# Patient Record
Sex: Male | Born: 2000 | Race: White | Hispanic: No | Marital: Single | Smoking: Never smoker
Health system: Southern US, Community
[De-identification: ages and names within clinical notes are randomized; demographics above are authoritative.]

---
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
## 2018-01-04 ENCOUNTER — Ambulatory Visit (INDEPENDENT_AMBULATORY_CARE_PROVIDER_SITE_OTHER): Payer: 59 | Admitting: Sports Medicine

## 2018-01-04 ENCOUNTER — Encounter: Payer: Self-pay | Admitting: Sports Medicine

## 2018-01-04 VITALS — BP 120/78 | HR 78 | Ht 72.02 in | Wt 193.0 lb

## 2018-01-04 DIAGNOSIS — M67432 Ganglion, left wrist: Secondary | ICD-10-CM | POA: Diagnosis not present

## 2018-01-04 DIAGNOSIS — M25532 Pain in left wrist: Secondary | ICD-10-CM | POA: Diagnosis not present

## 2018-01-04 DIAGNOSIS — M25332 Other instability, left wrist: Secondary | ICD-10-CM

## 2018-01-04 MED ORDER — IBUPROFEN-FAMOTIDINE 800-26.6 MG PO TABS: 1.0000 | ORAL_TABLET | Freq: Three times a day (TID) | ORAL | 2 refills | Status: AC | PRN

## 2018-01-04 NOTE — Progress Notes (Signed)
Adam Guerrero. [name], [place] at [place] - 17 y.o. male MRN [id number]  Date of birth: 16-Oct-2000  Visit Date: 01/04/2018   PCP: Patient, No Pcp Per   Referred by: No ref. provider found   SUBJECTIVE:  Chief Complaint  Patient presents with  . f/u L wrist    Steroid inj 10/30/17, reports good improvement. He is able to bend the wrist with minimal pain.     HPI: Patient is here for follow-up of left wrist pain.  Reports overall good improvement.  Is still continuing to give him some issues with typing and weightlifting.  He has run out of the Duexis prescription but has only been needing to take over-the-counter ibuprofen intermittently.  He underwent a injection as above.  REVIEW OF SYSTEMS: He denies any nighttime disturbances.  No clicking or significant popping.   HISTORY:  Prior history reviewed and updated per electronic medical record.  Social History   Occupational History  . Not on file  Tobacco Use  . Smoking status: Never Smoker  . Smokeless tobacco: Never Used  Substance and Sexual Activity  . Alcohol use: Not on file  . Drug use: Not on file  . Sexual activity: Not on file   Social History   Social History Narrative  . Not on file    OBJECTIVE:  VS:  HT:6' 0.02" (182.9 cm)   WT:193 lb (87.5 kg)  BMI:26.17    BP:120/78  HR:78bpm  TEMP: ( )  RESP:98 %   PHYSICAL EXAM: Pleasant young adult male.  No acute distress.  Alert and appropriate.  Left wrist is overall well aligned.  He is a small amount of pain over the scapholunate interval but this is minimal.  He has small amount of pain with ulnar deviation without clicking or catching.  He has no pain with ulnar deviation with axial load.  Grip strength is intact.  Normal elbow range of motion.  There is a small cystic mass across the dorsum of the wrist but this is minimal.   Prior arthrogram reassuring for no significant soft  tissue injury.  There is a well-defined 5 x 14 x 8 mm ganglion cyst from the midcarpal joint.  ASSESSMENT   1. Left wrist pain   2. Ganglion cyst of wrist, left   3. Carpal instability of left wrist     PLAN:  Pertinent additional documentation may be included in corresponding procedure notes, imaging studies, problem based documentation and patient instructions.  Procedures:  None  Medications:  Meds ordered this encounter  Medications  . Ibuprofen-Famotidine (DUEXIS) 800-26.6 MG TABS    Sig: Take 1 tablet by mouth 3 (three) times daily as needed. 1 tab po tid X 14 days then 1 tab po tid as needed    Dispense:  90 tablet    Refill:  2    Home Phone      [phone] Mobile          [phone]     Discussion/Instructions: No problem-specific Assessment & Plan notes found for this encounter.  We discussed multiple options with him today.  He does have some slight instability but no significant widening of the scapholunate interval.  At this point avoidance of terminal wrist extension is recommended.  Intermittent use of anti-inflammatories as needed.  Prescription provided today.    If any lack of improvement: Consider referral to Ortho hand for arthroscopic debridement and cyst  excision.   Return if symptoms worsen or fail to improve.          Gerda Diss, [facility] Physician

## 2018-01-22 DIAGNOSIS — M9902 Segmental and somatic dysfunction of thoracic region: Secondary | ICD-10-CM | POA: Diagnosis not present

## 2018-01-22 DIAGNOSIS — M542 Cervicalgia: Secondary | ICD-10-CM | POA: Diagnosis not present

## 2018-01-22 DIAGNOSIS — M9901 Segmental and somatic dysfunction of cervical region: Secondary | ICD-10-CM | POA: Diagnosis not present

## 2018-01-25 DIAGNOSIS — M9901 Segmental and somatic dysfunction of cervical region: Secondary | ICD-10-CM | POA: Diagnosis not present

## 2018-01-25 DIAGNOSIS — M542 Cervicalgia: Secondary | ICD-10-CM | POA: Diagnosis not present

## 2018-01-25 DIAGNOSIS — M9902 Segmental and somatic dysfunction of thoracic region: Secondary | ICD-10-CM | POA: Diagnosis not present

## 2018-02-01 DIAGNOSIS — M542 Cervicalgia: Secondary | ICD-10-CM | POA: Diagnosis not present

## 2018-02-01 DIAGNOSIS — M9901 Segmental and somatic dysfunction of cervical region: Secondary | ICD-10-CM | POA: Diagnosis not present

## 2018-02-01 DIAGNOSIS — M9902 Segmental and somatic dysfunction of thoracic region: Secondary | ICD-10-CM | POA: Diagnosis not present

## 2018-02-15 DIAGNOSIS — M9902 Segmental and somatic dysfunction of thoracic region: Secondary | ICD-10-CM | POA: Diagnosis not present

## 2018-02-15 DIAGNOSIS — M9901 Segmental and somatic dysfunction of cervical region: Secondary | ICD-10-CM | POA: Diagnosis not present

## 2018-02-15 DIAGNOSIS — M542 Cervicalgia: Secondary | ICD-10-CM | POA: Diagnosis not present

## 2018-02-18 DIAGNOSIS — M24812 Other specific joint derangements of left shoulder, not elsewhere classified: Secondary | ICD-10-CM | POA: Diagnosis not present

## 2018-02-18 DIAGNOSIS — S4992XA Unspecified injury of left shoulder and upper arm, initial encounter: Secondary | ICD-10-CM | POA: Diagnosis not present

## 2018-02-20 ENCOUNTER — Telehealth: Payer: Self-pay | Admitting: Sports Medicine

## 2018-02-20 NOTE — Telephone Encounter (Signed)
Scheduled pt for 02/21/18 at 12:40. Dr. Paulla Fore aware.

## 2018-02-20 NOTE — Telephone Encounter (Signed)
Caller states that has had a torn muscle and had injections and movement therapy and PT. pain for past couple months has been non existent, when woke up this morning unable to lift arm higher than chest level and cannot rotate arm (having pain 4 without movement, when moves 10) also didn't have surgery on it. Per [facility]

## 2018-02-21 ENCOUNTER — Ambulatory Visit: Payer: 59 | Admitting: Sports Medicine

## 2018-02-21 ENCOUNTER — Ambulatory Visit (INDEPENDENT_AMBULATORY_CARE_PROVIDER_SITE_OTHER): Payer: 59

## 2018-02-21 ENCOUNTER — Ambulatory Visit: Payer: Self-pay

## 2018-02-21 ENCOUNTER — Encounter: Payer: Self-pay | Admitting: Sports Medicine

## 2018-02-21 VITALS — BP 122/74 | HR 74 | Ht 72.04 in | Wt 192.2 lb

## 2018-02-21 DIAGNOSIS — M25312 Other instability, left shoulder: Secondary | ICD-10-CM

## 2018-02-21 DIAGNOSIS — M25512 Pain in left shoulder: Secondary | ICD-10-CM | POA: Diagnosis not present

## 2018-02-21 MED ORDER — IBUPROFEN-FAMOTIDINE 800-26.6 MG PO TABS: 1.0000 | ORAL_TABLET | Freq: Three times a day (TID) | ORAL | 2 refills | Status: AC | PRN

## 2018-02-21 MED ORDER — IBUPROFEN-FAMOTIDINE 800-26.6 MG PO TABS: 1.0000 | ORAL_TABLET | Freq: Three times a day (TID) | ORAL | 0 refills | Status: AC | PRN

## 2018-02-21 NOTE — Progress Notes (Signed)
Adam Guerrero. [name], [place] [facility] [place] - 18 y.o. male MRN [id number]  Date of birth: 04-10-2000  Visit Date: February 24, 2018  PCP: Patient, No Pcp Per   Referred by: No ref. provider found  SUBJECTIVE:  Chief Complaint  Patient presents with  . Left Shoulder - Follow-up  . Initial Assessment    L shoulder pain secondary to dislocation    HPI: Patient is here for an acute work in visit after waking up first thing in the morning 3 days ago noticing that his shoulder was swollen and markedly painful with any type of abduction, forward flexion or internal rotation activities.  He had acute onset of pain at home and struck his knees while gently reaching overhead.  He denies knowing any eliciting injury and was seen by the [facility] off campus Dr. who was suspicious for a nocturnal dislocation.  The placed in a shoulder immobilizer and gave him anti-inflammatories but he has not been taking these he is only been using the shoulder immobilizer.  He has been seen by Dr. 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  REVIEW OF SYSTEMS: Denies fevers, chills, recent weight gain or weight loss.  No night sweats. No significant nighttime awakenings due to this issue.  HISTORY:  Prior history reviewed and updated per electronic medical record.  Patient Active Problem List   Diagnosis Date Noted  . Left wrist pain 09/30/2017    10/27/2017 MR arthrogram IMPRESSION: 1. 14 mm ganglion cyst dorsal to the proximal capitate. 2. No ligamentous injury.  Intact TFCC.   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  There is a questionable area on sagittal views of the inferior glenoid that raises my  suspicion for labral injury    Social History   Occupational History  . Not on file  Tobacco Use  . Smoking status: Never Smoker  . Smokeless tobacco: Never Used  Substance and Sexual Activity  . Alcohol use: Not on file  . Drug use: Not on file  . Sexual activity: Not on file   Social History   Social History Narrative  . Not on file     OBJECTIVE:  VS:  HT:6' 0.04" (183 cm)   WT:192 lb 3.2 oz (87.2 kg)  BMI:26.03    BP:122/74  HR:74bpm  TEMP: ( )  RESP:96 %   PHYSICAL EXAM: His left shoulder is slightly more swollen than the right side.  There is no significant bruising or ecchymosis.  He has pain with any type of forward flexion or abduction past 60 degrees.  His internal rotation strength is intact however external rotation strength is diminished.  He has a markedly positive sag sign with positive clunk test.  Three-view x-rays of the shoulder obtained today that are negative for acute fracture.   ASSESSMENT:  1. Shoulder instability, left     PROCEDURES:    none  PLAN:  Pertinent additional documentation may be included in corresponding procedure notes, imaging studies, problem based documentation and patient instructions.  No problem-specific Assessment & Plan notes found for this encounter.   This is concerning that he has had a nontraumatic shoulder dislocation  Patient needs to have this surgically stabilized and he  will be following up with Dr. Tamera Punt for this.  Shoulder immobilizer provided today and refill on [facility] provided.  X-rays reassuring for no acute bony involvement.   Meds ordered this encounter  Medications  . Ibuprofen-Famotidine (DUEXIS) 800-26.6 MG TABS    Sig: Take 1 tablet by mouth 3 (three) times daily as needed. 1 tab po tid X 14 days then 1 tab po tid as needed    Dispense:  90 tablet    Refill:  2    Home Phone      [phone] Mobile          [phone]   . Ibuprofen-Famotidine (DUEXIS) 800-26.6 MG TABS    Sig: Take  1 tablet by mouth 3 (three) times daily as needed for up to 1 day.    Dispense:  9 tablet    Refill:  0   Lab Orders  No laboratory test(s) ordered today    Imaging Orders     DG Shoulder Left  Referral Orders     Ambulatory referral to Orthopedics   No follow-ups on file.          Gerda Diss, [facility] Physician

## 2018-02-21 NOTE — Patient Instructions (Addendum)
Instructions for Duexis, [place] and [facility]:  Your prescription will be filled through a participating [facility] mail order pharmacy.  You will receive a phone call or text from one of the participating pharmacies which can be located in any state in the [place].  You must communicate directly with them to have this medication filled.  When the pharmacy contacts you, they will need your mailing address (for shipment of the medication) andy they will need payment information if you have a copay (typically no more than $10). If you have not heard from them 2-3 days after your appointment with Dr. Paulla Fore, contact [facility] directly at [phone].

## 2018-03-02 DIAGNOSIS — S43432D Superior glenoid labrum lesion of left shoulder, subsequent encounter: Secondary | ICD-10-CM | POA: Diagnosis not present

## 2018-03-06 DIAGNOSIS — M25312 Other instability, left shoulder: Secondary | ICD-10-CM | POA: Diagnosis not present

## 2018-03-06 DIAGNOSIS — S43432D Superior glenoid labrum lesion of left shoulder, subsequent encounter: Secondary | ICD-10-CM | POA: Diagnosis not present

## 2018-03-06 DIAGNOSIS — G8918 Other acute postprocedural pain: Secondary | ICD-10-CM | POA: Diagnosis not present

## 2018-03-06 DIAGNOSIS — S43492A Other sprain of left shoulder joint, initial encounter: Secondary | ICD-10-CM | POA: Diagnosis not present

## 2018-04-19 DIAGNOSIS — S43432D Superior glenoid labrum lesion of left shoulder, subsequent encounter: Secondary | ICD-10-CM | POA: Diagnosis not present

## 2018-04-30 DIAGNOSIS — S43432D Superior glenoid labrum lesion of left shoulder, subsequent encounter: Secondary | ICD-10-CM | POA: Diagnosis not present

## 2018-05-02 DIAGNOSIS — S43432D Superior glenoid labrum lesion of left shoulder, subsequent encounter: Secondary | ICD-10-CM | POA: Diagnosis not present

## 2018-05-03 DIAGNOSIS — S43432D Superior glenoid labrum lesion of left shoulder, subsequent encounter: Secondary | ICD-10-CM | POA: Diagnosis not present

## 2018-05-07 DIAGNOSIS — S43432D Superior glenoid labrum lesion of left shoulder, subsequent encounter: Secondary | ICD-10-CM | POA: Diagnosis not present

## 2018-05-09 DIAGNOSIS — S43432D Superior glenoid labrum lesion of left shoulder, subsequent encounter: Secondary | ICD-10-CM | POA: Diagnosis not present

## 2018-05-12 IMAGING — CR DG CHEST 2V
2 series · 2 of 2 positions shown · non-contrast
Comparison: None

CLINICAL DATA: Cough for 1 month, hoarseness, question pneumonia

EXAM:
CHEST  2 VIEW

[w chest pa]
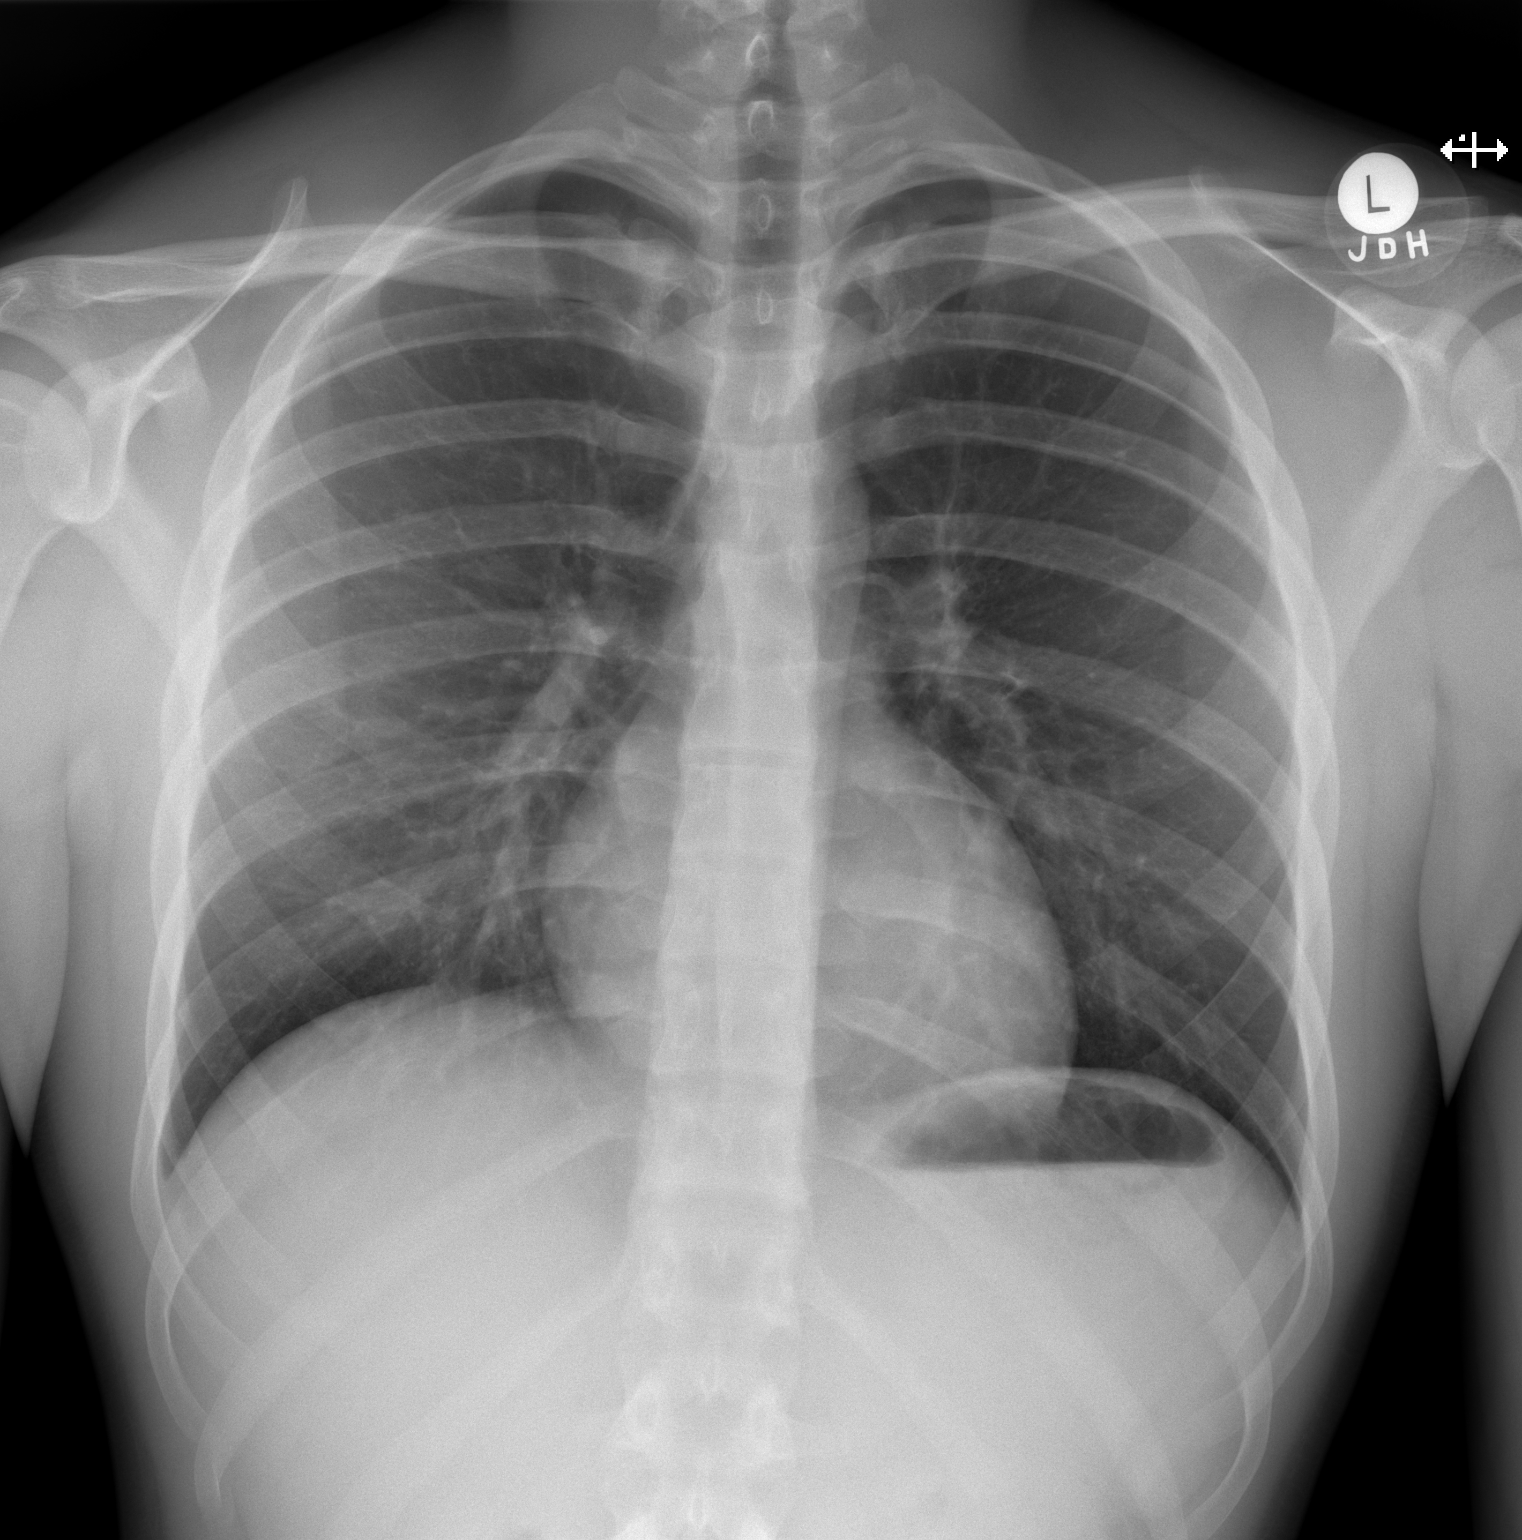

[w chest lat]
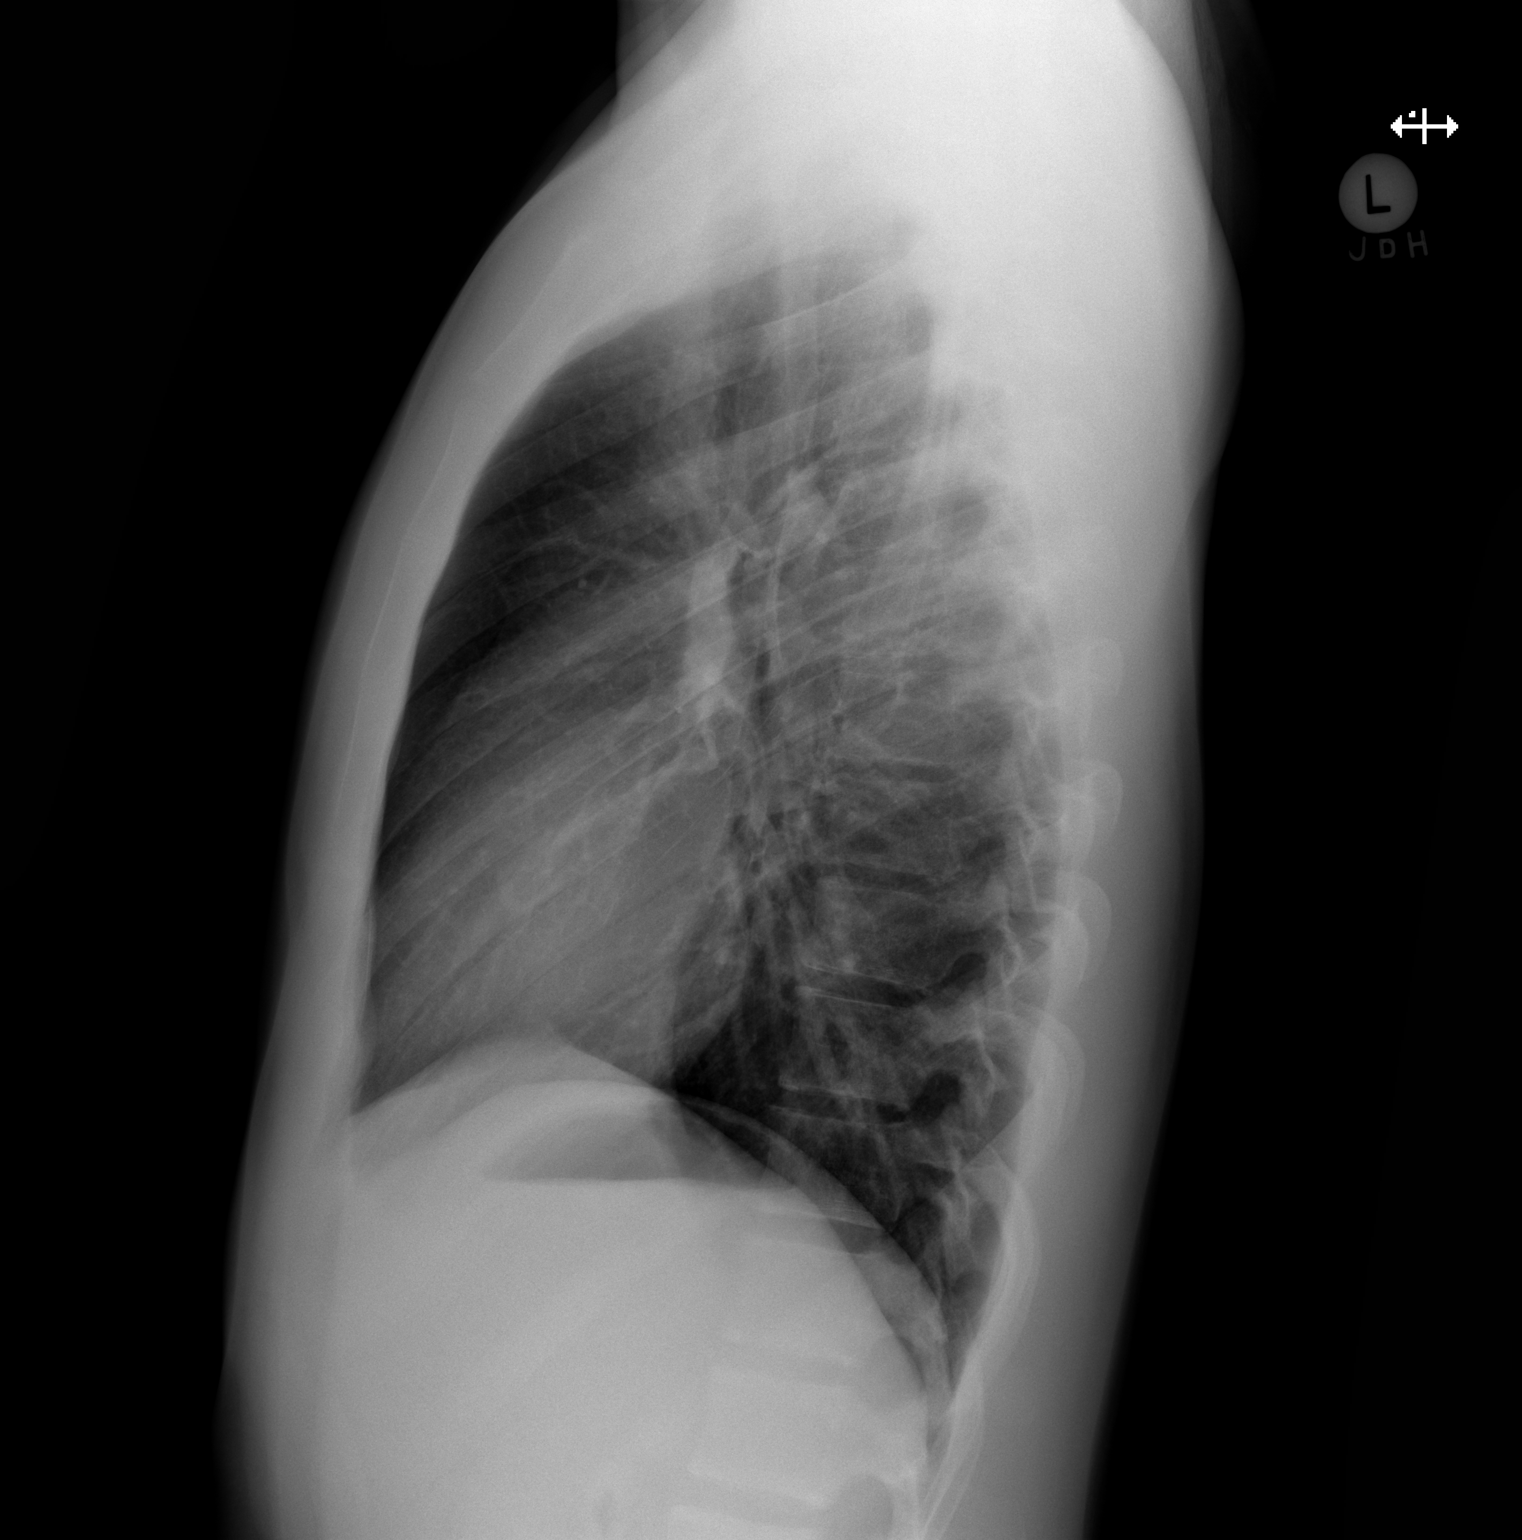

[2 of 2 positions shown; findings below may reference images not displayed]

FINDINGS: Normal heart size, mediastinal contours, and pulmonary vascularity.

Lungs clear.

No pleural effusion or pneumothorax.

Bones unremarkable.
IMPRESSION: Normal exam.

## 2018-05-14 DIAGNOSIS — S43432D Superior glenoid labrum lesion of left shoulder, subsequent encounter: Secondary | ICD-10-CM | POA: Diagnosis not present

## 2018-05-15 DIAGNOSIS — M9901 Segmental and somatic dysfunction of cervical region: Secondary | ICD-10-CM | POA: Diagnosis not present

## 2018-05-15 DIAGNOSIS — M9902 Segmental and somatic dysfunction of thoracic region: Secondary | ICD-10-CM | POA: Diagnosis not present

## 2018-05-15 DIAGNOSIS — M542 Cervicalgia: Secondary | ICD-10-CM | POA: Diagnosis not present

## 2018-05-16 DIAGNOSIS — S43432D Superior glenoid labrum lesion of left shoulder, subsequent encounter: Secondary | ICD-10-CM | POA: Diagnosis not present

## 2018-05-16 DIAGNOSIS — M9901 Segmental and somatic dysfunction of cervical region: Secondary | ICD-10-CM | POA: Diagnosis not present

## 2018-05-16 DIAGNOSIS — M542 Cervicalgia: Secondary | ICD-10-CM | POA: Diagnosis not present

## 2018-05-16 DIAGNOSIS — M9902 Segmental and somatic dysfunction of thoracic region: Secondary | ICD-10-CM | POA: Diagnosis not present

## 2018-05-17 DIAGNOSIS — M542 Cervicalgia: Secondary | ICD-10-CM | POA: Diagnosis not present

## 2018-05-17 DIAGNOSIS — M9901 Segmental and somatic dysfunction of cervical region: Secondary | ICD-10-CM | POA: Diagnosis not present

## 2018-05-17 DIAGNOSIS — M9902 Segmental and somatic dysfunction of thoracic region: Secondary | ICD-10-CM | POA: Diagnosis not present

## 2018-05-21 DIAGNOSIS — S43432D Superior glenoid labrum lesion of left shoulder, subsequent encounter: Secondary | ICD-10-CM | POA: Diagnosis not present

## 2018-05-23 DIAGNOSIS — S43432D Superior glenoid labrum lesion of left shoulder, subsequent encounter: Secondary | ICD-10-CM | POA: Diagnosis not present

## 2018-05-24 DIAGNOSIS — M542 Cervicalgia: Secondary | ICD-10-CM | POA: Diagnosis not present

## 2018-05-24 DIAGNOSIS — M9902 Segmental and somatic dysfunction of thoracic region: Secondary | ICD-10-CM | POA: Diagnosis not present

## 2018-05-24 DIAGNOSIS — M9901 Segmental and somatic dysfunction of cervical region: Secondary | ICD-10-CM | POA: Diagnosis not present

## 2018-06-04 DIAGNOSIS — S43432D Superior glenoid labrum lesion of left shoulder, subsequent encounter: Secondary | ICD-10-CM | POA: Diagnosis not present

## 2019-01-23 IMAGING — MR MR WRIST*L* W/CM
6 series · 40 of 40 positions shown · IV contrast (agent unspecified)
Comparison: Left wrist x-rays dated September 01, 2017.

CONTRAST:  See injection documentation.

CLINICAL DATA: Persistent left wrist pain with numbness and
weakness since fall [REDACTED].

EXAM:
MR OF THE LEFT WRIST WITH CONTRAST (MR ARTHROGRAM)
TECHNIQUE: Multiplanar, multisequence MR imaging of the left wrist was
performed following the administration of intra-articular contrast.

[Series 3: T2 fat-sat · axial · left · 3.0mm · 0.31mm/px · z∈[-60,+16]mm · 8 of 25 slices shown (1 of 2)]
[im 1/25]
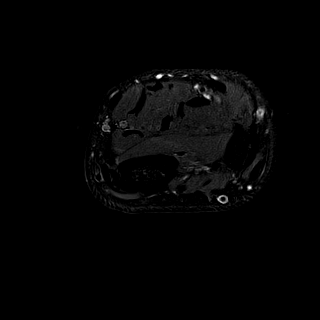
[im 4/25]
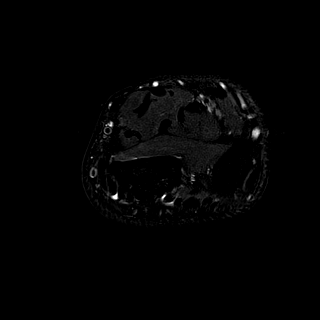
[im 7/25]
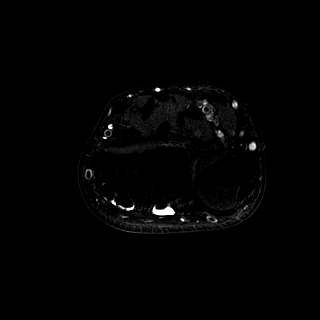
[im 11/25]
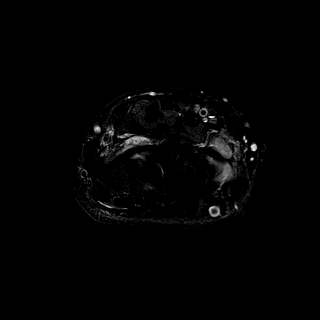
[im 14/25]
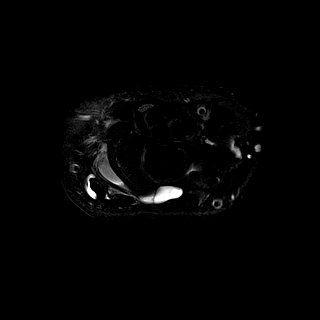
[im 18/25]
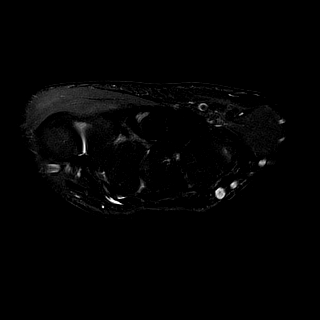
[im 21/25]
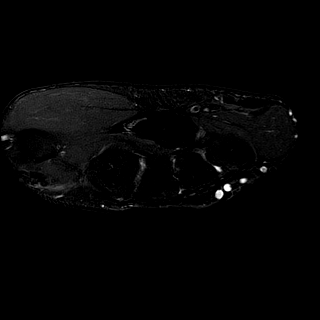
[im 25/25]
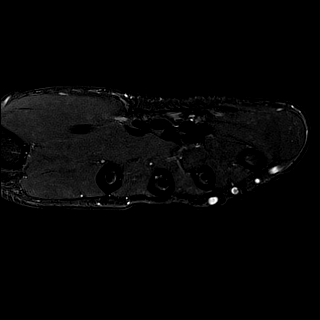

[Series 4: T1 fat-sat · axial · left · 3.0mm · 0.31mm/px · z∈[-58,+14]mm · 9 of 24 slices shown (1 of 3)]
[im 1/24]
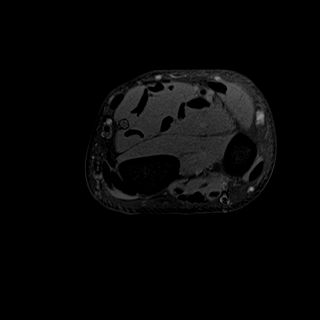
[im 3/24]
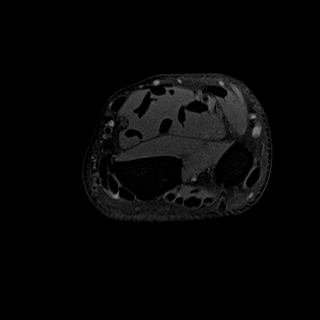
[im 6/24]
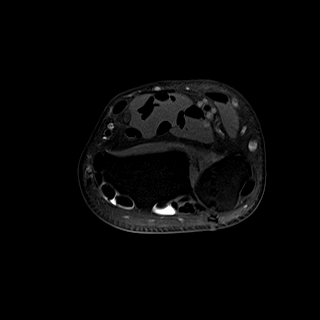
[im 9/24]
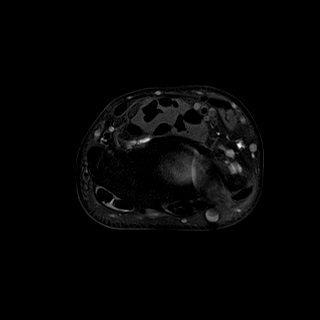
[im 12/24]
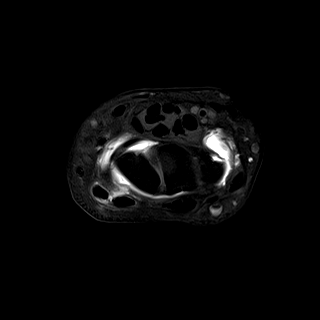
[im 15/24]
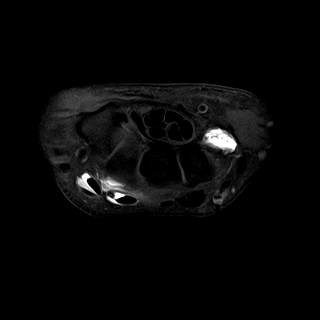
[im 18/24]
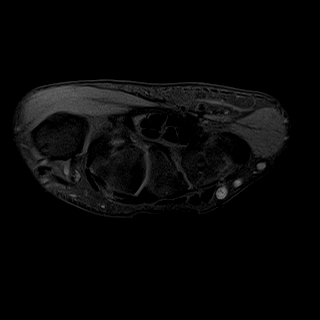
[im 21/24]
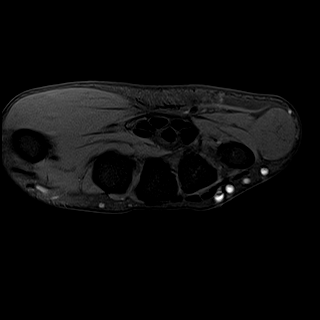
[im 24/24]
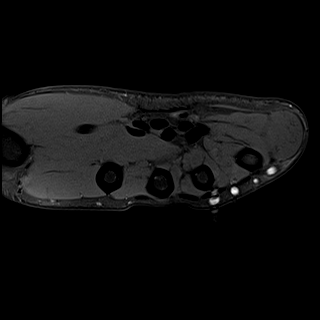

[Series 5: T1 fat-sat · coronal · left · 3.0mm · 0.31mm/px · 5 of 13 slices shown (2 of 3)]
[im 1/13]
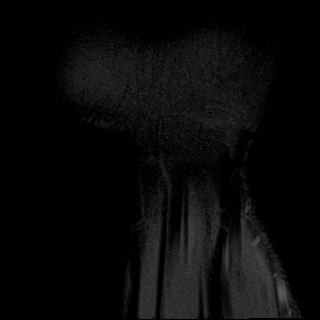
[im 4/13]
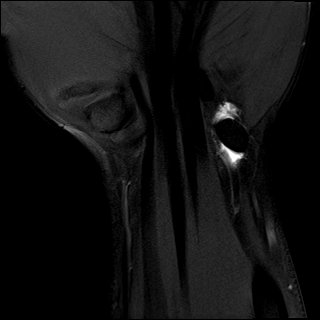
[im 7/13]
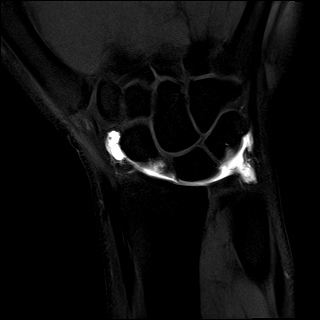
[im 10/13]
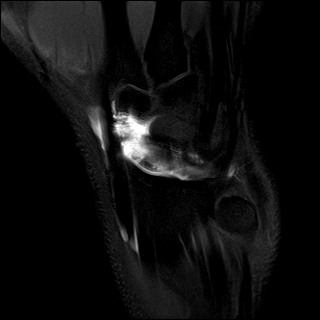
[im 13/13]
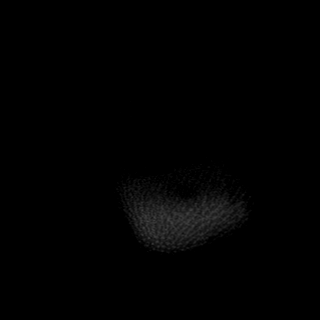

[Series 6: T1 · coronal · left · 3.0mm · 0.31mm/px · 5 of 13 slices shown]
[im 1/13]
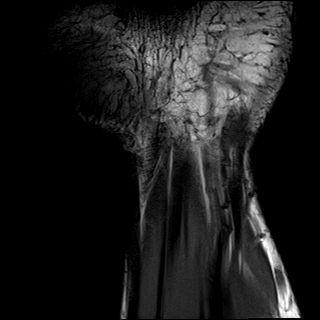
[im 4/13]
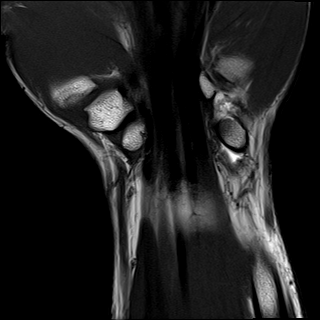
[im 7/13]
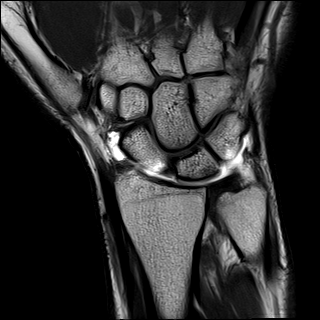
[im 10/13]
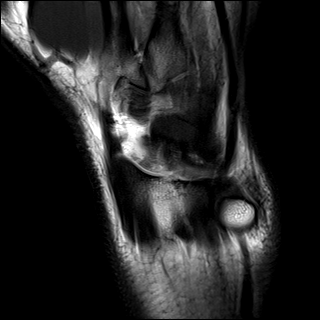
[im 13/13]
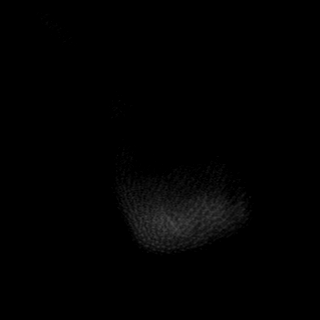

[Series 7: T2 fat-sat · coronal · left · 3.0mm · 0.31mm/px · 5 of 13 slices shown (2 of 2)]
[im 1/13]
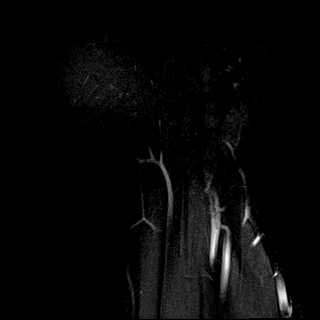
[im 4/13]
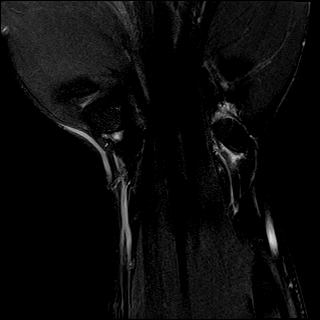
[im 7/13]
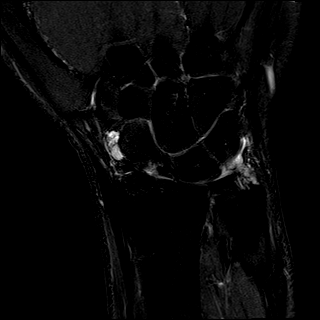
[im 10/13]
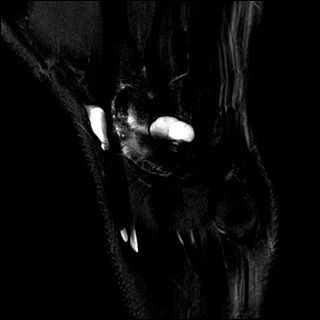
[im 13/13]
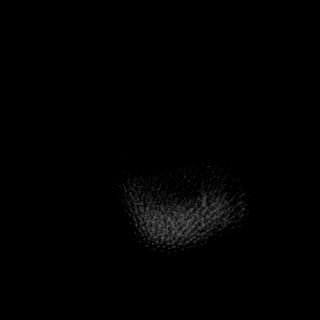

[Series 8: T1 fat-sat · sagittal · left · 3.0mm · 0.31mm/px · 8 of 22 slices shown (3 of 3)]
[im 1/22]
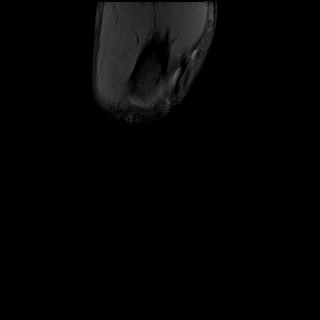
[im 4/22]
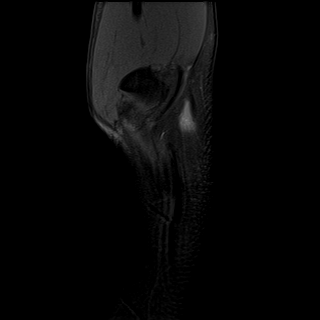
[im 7/22]
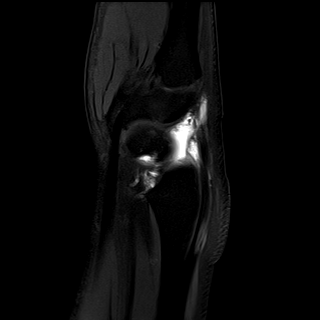
[im 10/22]
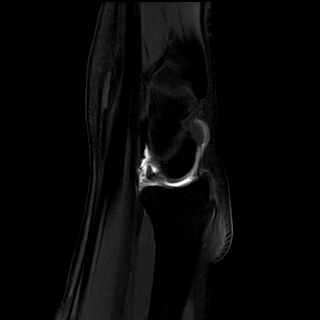
[im 13/22]
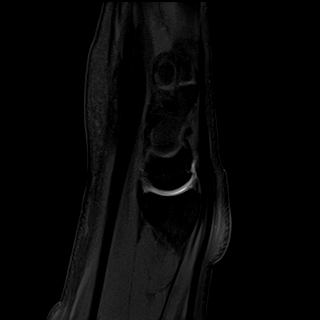
[im 16/22]
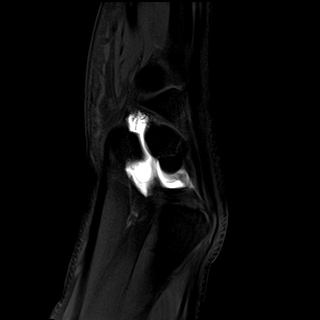
[im 19/22]
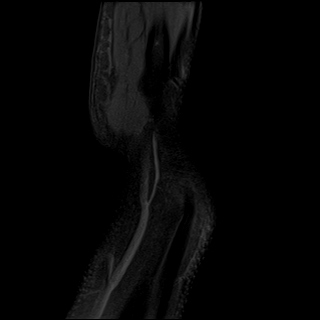
[im 22/22]
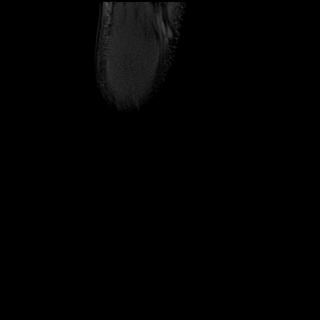

[40 of 40 positions shown; findings below may reference images not displayed]

FINDINGS: Ligaments: Intact scapholunate and lunotriquetral ligaments.

Triangular fibrocartilage: Intact TFCC.

Tendons: Intact flexor and extensor compartment tendons. Iatrogenic
contrast within the second and third extensor compartment tendon
sheaths.

Carpal tunnel/median nerve: Normal carpal tunnel. Normal median
nerve.

Guyon's canal: Normal.

Joint/cartilage: The radiocarpal joint is distended with
intra-articular contrast. No chondral defect.

Bones/carpal alignment: No marrow signal abnormality. Normal
alignment. No aggressive osseous lesion.

Other: There is a 5 x 14 x 8 mm (AP by transverse by CC) oval
well-defined T2 hyperintense, T1 hypointense lesion dorsal to the
proximal capitate at the level of the mid carpal joint.
IMPRESSION: 1. 14 mm ganglion cyst dorsal to the proximal capitate.
2. No ligamentous injury.  Intact TFCC.

## 2022-02-02 ENCOUNTER — Other Ambulatory Visit (HOSPITAL_BASED_OUTPATIENT_CLINIC_OR_DEPARTMENT_OTHER): Payer: Self-pay

## 2022-02-07 ENCOUNTER — Other Ambulatory Visit (HOSPITAL_BASED_OUTPATIENT_CLINIC_OR_DEPARTMENT_OTHER): Payer: Self-pay

## 2022-02-07 ENCOUNTER — Encounter (HOSPITAL_BASED_OUTPATIENT_CLINIC_OR_DEPARTMENT_OTHER): Payer: Self-pay | Admitting: Pharmacist

## 2022-02-07 MED ORDER — LISDEXAMFETAMINE DIMESYLATE 60 MG PO CAPS
60.0000 mg | ORAL_CAPSULE | Freq: Every morning | ORAL | 0 refills | Status: AC
  Filled 2022-02-07 – 2022-02-09 (×3): qty 30, 30d supply, fill #0

## 2022-02-09 ENCOUNTER — Other Ambulatory Visit: Payer: Self-pay | Admitting: Family Medicine

## 2022-02-09 ENCOUNTER — Other Ambulatory Visit (HOSPITAL_BASED_OUTPATIENT_CLINIC_OR_DEPARTMENT_OTHER): Payer: Self-pay

## 2022-02-09 DIAGNOSIS — E049 Nontoxic goiter, unspecified: Secondary | ICD-10-CM

## 2022-02-09 MED ORDER — LISDEXAMFETAMINE DIMESYLATE 60 MG PO CAPS
60.0000 mg | ORAL_CAPSULE | Freq: Every morning | ORAL | 0 refills | Status: AC
  Filled 2022-02-09 – 2022-03-09 (×2): qty 30, 30d supply, fill #0

## 2022-02-09 MED ORDER — VYVANSE 60 MG PO CAPS
60.0000 mg | ORAL_CAPSULE | Freq: Every day | ORAL | 0 refills | Status: AC
  Filled 2022-04-05: qty 30, 30d supply, fill #0

## 2022-02-10 ENCOUNTER — Other Ambulatory Visit (HOSPITAL_BASED_OUTPATIENT_CLINIC_OR_DEPARTMENT_OTHER): Payer: Self-pay

## 2022-02-14 ENCOUNTER — Other Ambulatory Visit: Payer: 59

## 2022-03-07 ENCOUNTER — Other Ambulatory Visit: Payer: 59

## 2022-03-09 ENCOUNTER — Other Ambulatory Visit (HOSPITAL_BASED_OUTPATIENT_CLINIC_OR_DEPARTMENT_OTHER): Payer: Self-pay

## 2022-04-05 ENCOUNTER — Other Ambulatory Visit (HOSPITAL_BASED_OUTPATIENT_CLINIC_OR_DEPARTMENT_OTHER): Payer: Self-pay

## 2022-04-06 ENCOUNTER — Other Ambulatory Visit (HOSPITAL_BASED_OUTPATIENT_CLINIC_OR_DEPARTMENT_OTHER): Payer: Self-pay

## 2022-05-09 ENCOUNTER — Other Ambulatory Visit (HOSPITAL_BASED_OUTPATIENT_CLINIC_OR_DEPARTMENT_OTHER): Payer: Self-pay

## 2022-05-09 MED ORDER — LISDEXAMFETAMINE DIMESYLATE 60 MG PO CAPS
60.0000 mg | ORAL_CAPSULE | Freq: Every day | ORAL | 0 refills | Status: AC
  Filled 2022-10-24: qty 30, 30d supply, fill #0

## 2022-05-09 MED ORDER — LISDEXAMFETAMINE DIMESYLATE 60 MG PO CAPS
60.0000 mg | ORAL_CAPSULE | Freq: Every day | ORAL | 0 refills | Status: AC
  Filled 2022-09-20: qty 30, 30d supply, fill #0

## 2022-05-09 MED ORDER — LISDEXAMFETAMINE DIMESYLATE 60 MG PO CAPS
60.0000 mg | ORAL_CAPSULE | Freq: Every morning | ORAL | 0 refills | Status: AC
  Filled 2022-05-09 – 2022-05-10 (×2): qty 30, 30d supply, fill #0

## 2022-05-10 ENCOUNTER — Other Ambulatory Visit: Payer: Self-pay

## 2022-05-10 ENCOUNTER — Other Ambulatory Visit (HOSPITAL_BASED_OUTPATIENT_CLINIC_OR_DEPARTMENT_OTHER): Payer: Self-pay

## 2022-05-10 MED ORDER — ALBUTEROL SULFATE HFA 108 (90 BASE) MCG/ACT IN AERS
1.0000 | INHALATION_SPRAY | RESPIRATORY_TRACT | 0 refills | Status: AC
  Filled 2022-05-10: qty 6.7, 17d supply, fill #0

## 2022-05-10 MED ORDER — DULOXETINE HCL 30 MG PO CPEP
60.0000 mg | ORAL_CAPSULE | Freq: Every day | ORAL | 0 refills | Status: DC
  Filled 2022-05-10: qty 90, 45d supply, fill #0

## 2022-05-18 ENCOUNTER — Other Ambulatory Visit: Payer: Self-pay

## 2022-06-01 ENCOUNTER — Other Ambulatory Visit (HOSPITAL_BASED_OUTPATIENT_CLINIC_OR_DEPARTMENT_OTHER): Payer: Self-pay

## 2022-06-01 MED ORDER — DULOXETINE HCL 30 MG PO CPEP
30.0000 mg | ORAL_CAPSULE | Freq: Every day | ORAL | 3 refills | Status: AC
  Filled 2022-06-01 – 2022-08-10 (×3): qty 90, 90d supply, fill #0
  Filled 2022-11-03: qty 90, 90d supply, fill #1
  Filled 2023-01-03 – 2023-02-07 (×2): qty 90, 90d supply, fill #2
  Filled 2023-05-03: qty 90, 90d supply, fill #3

## 2022-06-01 MED ORDER — LISDEXAMFETAMINE DIMESYLATE 60 MG PO CAPS
60.0000 mg | ORAL_CAPSULE | Freq: Every morning | ORAL | 0 refills | Status: AC
  Filled 2022-06-08: qty 30, 30d supply, fill #0

## 2022-06-08 ENCOUNTER — Other Ambulatory Visit (HOSPITAL_BASED_OUTPATIENT_CLINIC_OR_DEPARTMENT_OTHER): Payer: Self-pay

## 2022-07-14 ENCOUNTER — Other Ambulatory Visit (HOSPITAL_BASED_OUTPATIENT_CLINIC_OR_DEPARTMENT_OTHER): Payer: Self-pay

## 2022-07-14 MED ORDER — VYVANSE 60 MG PO CAPS
60.0000 mg | ORAL_CAPSULE | Freq: Every morning | ORAL | 0 refills | Status: AC
  Filled 2022-07-14 – 2022-08-16 (×3): qty 30, 30d supply, fill #0

## 2022-08-10 ENCOUNTER — Other Ambulatory Visit (HOSPITAL_BASED_OUTPATIENT_CLINIC_OR_DEPARTMENT_OTHER): Payer: Self-pay

## 2022-08-12 ENCOUNTER — Other Ambulatory Visit (HOSPITAL_BASED_OUTPATIENT_CLINIC_OR_DEPARTMENT_OTHER): Payer: Self-pay

## 2022-08-16 ENCOUNTER — Other Ambulatory Visit (HOSPITAL_BASED_OUTPATIENT_CLINIC_OR_DEPARTMENT_OTHER): Payer: Self-pay

## 2022-08-17 ENCOUNTER — Other Ambulatory Visit: Payer: Self-pay

## 2022-09-20 ENCOUNTER — Other Ambulatory Visit (HOSPITAL_BASED_OUTPATIENT_CLINIC_OR_DEPARTMENT_OTHER): Payer: Self-pay

## 2022-09-21 ENCOUNTER — Other Ambulatory Visit (HOSPITAL_BASED_OUTPATIENT_CLINIC_OR_DEPARTMENT_OTHER): Payer: Self-pay

## 2022-10-24 ENCOUNTER — Other Ambulatory Visit (HOSPITAL_BASED_OUTPATIENT_CLINIC_OR_DEPARTMENT_OTHER): Payer: Self-pay

## 2022-11-03 ENCOUNTER — Other Ambulatory Visit (HOSPITAL_BASED_OUTPATIENT_CLINIC_OR_DEPARTMENT_OTHER): Payer: Self-pay

## 2022-11-09 ENCOUNTER — Other Ambulatory Visit (HOSPITAL_BASED_OUTPATIENT_CLINIC_OR_DEPARTMENT_OTHER): Payer: Self-pay

## 2022-11-10 ENCOUNTER — Other Ambulatory Visit: Payer: Self-pay

## 2022-11-24 ENCOUNTER — Other Ambulatory Visit (HOSPITAL_BASED_OUTPATIENT_CLINIC_OR_DEPARTMENT_OTHER): Payer: Self-pay

## 2022-11-25 ENCOUNTER — Other Ambulatory Visit (HOSPITAL_BASED_OUTPATIENT_CLINIC_OR_DEPARTMENT_OTHER): Payer: Self-pay

## 2022-11-25 MED ORDER — VYVANSE 60 MG PO CAPS
60.0000 mg | ORAL_CAPSULE | Freq: Every morning | ORAL | 0 refills | Status: DC
  Filled 2022-11-25: qty 30, 30d supply, fill #0

## 2022-11-28 ENCOUNTER — Other Ambulatory Visit (HOSPITAL_BASED_OUTPATIENT_CLINIC_OR_DEPARTMENT_OTHER): Payer: Self-pay

## 2022-12-07 ENCOUNTER — Other Ambulatory Visit (HOSPITAL_BASED_OUTPATIENT_CLINIC_OR_DEPARTMENT_OTHER): Payer: Self-pay

## 2022-12-07 MED ORDER — EMTRICITABINE-TENOFOVIR DF 200-300 MG PO TABS
1.0000 | ORAL_TABLET | Freq: Every day | ORAL | 2 refills | Status: AC
  Filled 2022-12-07 – 2023-02-06 (×2): qty 30, 30d supply, fill #0

## 2022-12-13 ENCOUNTER — Other Ambulatory Visit (HOSPITAL_BASED_OUTPATIENT_CLINIC_OR_DEPARTMENT_OTHER): Payer: Self-pay

## 2022-12-13 MED ORDER — LISDEXAMFETAMINE DIMESYLATE 60 MG PO CAPS
60.0000 mg | ORAL_CAPSULE | Freq: Every morning | ORAL | 0 refills | Status: AC
  Filled 2023-02-06: qty 30, 30d supply, fill #0

## 2022-12-13 MED ORDER — LISDEXAMFETAMINE DIMESYLATE 60 MG PO CAPS
60.0000 mg | ORAL_CAPSULE | Freq: Every morning | ORAL | 0 refills | Status: AC
  Filled 2023-01-02: qty 30, 30d supply, fill #0

## 2022-12-13 MED ORDER — LISDEXAMFETAMINE DIMESYLATE 60 MG PO CAPS
60.0000 mg | ORAL_CAPSULE | Freq: Every morning | ORAL | 0 refills | Status: DC
  Filled 2023-03-06: qty 30, 30d supply, fill #0

## 2022-12-19 ENCOUNTER — Other Ambulatory Visit (HOSPITAL_BASED_OUTPATIENT_CLINIC_OR_DEPARTMENT_OTHER): Payer: Self-pay

## 2023-01-02 ENCOUNTER — Other Ambulatory Visit (HOSPITAL_BASED_OUTPATIENT_CLINIC_OR_DEPARTMENT_OTHER): Payer: Self-pay

## 2023-01-03 ENCOUNTER — Other Ambulatory Visit (HOSPITAL_BASED_OUTPATIENT_CLINIC_OR_DEPARTMENT_OTHER): Payer: Self-pay

## 2023-02-06 ENCOUNTER — Other Ambulatory Visit (HOSPITAL_BASED_OUTPATIENT_CLINIC_OR_DEPARTMENT_OTHER): Payer: Self-pay

## 2023-02-07 ENCOUNTER — Other Ambulatory Visit (HOSPITAL_BASED_OUTPATIENT_CLINIC_OR_DEPARTMENT_OTHER): Payer: Self-pay

## 2023-02-08 ENCOUNTER — Other Ambulatory Visit (HOSPITAL_BASED_OUTPATIENT_CLINIC_OR_DEPARTMENT_OTHER): Payer: Self-pay

## 2023-03-06 ENCOUNTER — Other Ambulatory Visit (HOSPITAL_BASED_OUTPATIENT_CLINIC_OR_DEPARTMENT_OTHER): Payer: Self-pay

## 2023-04-04 ENCOUNTER — Other Ambulatory Visit (HOSPITAL_BASED_OUTPATIENT_CLINIC_OR_DEPARTMENT_OTHER): Payer: Self-pay

## 2023-04-04 MED ORDER — LISDEXAMFETAMINE DIMESYLATE 60 MG PO CAPS
60.0000 mg | ORAL_CAPSULE | Freq: Every morning | ORAL | 0 refills | Status: AC
  Filled 2023-05-05: qty 30, 30d supply, fill #0

## 2023-04-04 MED ORDER — LISDEXAMFETAMINE DIMESYLATE 60 MG PO CAPS
60.0000 mg | ORAL_CAPSULE | Freq: Every morning | ORAL | 0 refills | Status: AC
  Filled 2023-05-31 – 2023-06-01 (×2): qty 30, 30d supply, fill #0
  Filled ????-??-??: fill #0

## 2023-04-04 MED ORDER — LISDEXAMFETAMINE DIMESYLATE 60 MG PO CAPS
60.0000 mg | ORAL_CAPSULE | Freq: Every morning | ORAL | 0 refills | Status: AC
  Filled 2023-04-04: qty 30, 30d supply, fill #0

## 2023-04-05 ENCOUNTER — Other Ambulatory Visit (HOSPITAL_BASED_OUTPATIENT_CLINIC_OR_DEPARTMENT_OTHER): Payer: Self-pay

## 2023-05-03 ENCOUNTER — Other Ambulatory Visit (HOSPITAL_BASED_OUTPATIENT_CLINIC_OR_DEPARTMENT_OTHER): Payer: Self-pay

## 2023-05-05 ENCOUNTER — Other Ambulatory Visit (HOSPITAL_BASED_OUTPATIENT_CLINIC_OR_DEPARTMENT_OTHER): Payer: Self-pay

## 2023-05-08 ENCOUNTER — Other Ambulatory Visit (HOSPITAL_BASED_OUTPATIENT_CLINIC_OR_DEPARTMENT_OTHER): Payer: Self-pay

## 2023-05-17 ENCOUNTER — Other Ambulatory Visit (HOSPITAL_BASED_OUTPATIENT_CLINIC_OR_DEPARTMENT_OTHER): Payer: Self-pay

## 2023-05-18 ENCOUNTER — Other Ambulatory Visit (HOSPITAL_BASED_OUTPATIENT_CLINIC_OR_DEPARTMENT_OTHER): Payer: Self-pay

## 2023-05-19 ENCOUNTER — Other Ambulatory Visit (HOSPITAL_BASED_OUTPATIENT_CLINIC_OR_DEPARTMENT_OTHER): Payer: Self-pay

## 2023-05-31 ENCOUNTER — Other Ambulatory Visit (HOSPITAL_BASED_OUTPATIENT_CLINIC_OR_DEPARTMENT_OTHER): Payer: Self-pay

## 2023-06-01 ENCOUNTER — Other Ambulatory Visit (HOSPITAL_BASED_OUTPATIENT_CLINIC_OR_DEPARTMENT_OTHER): Payer: Self-pay

## 2023-06-09 ENCOUNTER — Other Ambulatory Visit (HOSPITAL_BASED_OUTPATIENT_CLINIC_OR_DEPARTMENT_OTHER): Payer: Self-pay

## 2023-06-09 MED ORDER — METHYLPREDNISOLONE 4 MG PO TBPK
ORAL_TABLET | ORAL | 0 refills | Status: AC
  Filled 2023-06-09: qty 21, 6d supply, fill #0

## 2023-07-04 ENCOUNTER — Other Ambulatory Visit (HOSPITAL_BASED_OUTPATIENT_CLINIC_OR_DEPARTMENT_OTHER): Payer: Self-pay

## 2023-07-04 MED ORDER — LISDEXAMFETAMINE DIMESYLATE 60 MG PO CAPS
60.0000 mg | ORAL_CAPSULE | Freq: Every day | ORAL | 0 refills | Status: AC
  Filled 2023-07-04: qty 30, 30d supply, fill #0

## 2023-07-05 ENCOUNTER — Other Ambulatory Visit (HOSPITAL_BASED_OUTPATIENT_CLINIC_OR_DEPARTMENT_OTHER): Payer: Self-pay

## 2023-07-13 ENCOUNTER — Other Ambulatory Visit (HOSPITAL_BASED_OUTPATIENT_CLINIC_OR_DEPARTMENT_OTHER): Payer: Self-pay

## 2023-07-13 MED ORDER — DULOXETINE HCL 60 MG PO CPEP
60.0000 mg | ORAL_CAPSULE | Freq: Every day | ORAL | 3 refills | Status: AC
  Filled 2023-07-13: qty 90, 90d supply, fill #0
  Filled 2023-10-09: qty 90, 90d supply, fill #1
  Filled 2024-01-19: qty 90, 90d supply, fill #2

## 2023-08-01 ENCOUNTER — Other Ambulatory Visit (HOSPITAL_BASED_OUTPATIENT_CLINIC_OR_DEPARTMENT_OTHER): Payer: Self-pay

## 2023-08-02 ENCOUNTER — Other Ambulatory Visit (HOSPITAL_BASED_OUTPATIENT_CLINIC_OR_DEPARTMENT_OTHER): Payer: Self-pay

## 2023-08-02 MED ORDER — LISDEXAMFETAMINE DIMESYLATE 60 MG PO CAPS
60.0000 mg | ORAL_CAPSULE | Freq: Every morning | ORAL | 0 refills | Status: AC
  Filled 2023-08-02 – 2023-08-03 (×2): qty 30, 30d supply, fill #0

## 2023-08-03 ENCOUNTER — Other Ambulatory Visit (HOSPITAL_BASED_OUTPATIENT_CLINIC_OR_DEPARTMENT_OTHER): Payer: Self-pay

## 2023-08-04 ENCOUNTER — Other Ambulatory Visit (HOSPITAL_BASED_OUTPATIENT_CLINIC_OR_DEPARTMENT_OTHER): Payer: Self-pay

## 2023-08-31 ENCOUNTER — Other Ambulatory Visit (HOSPITAL_BASED_OUTPATIENT_CLINIC_OR_DEPARTMENT_OTHER): Payer: Self-pay

## 2023-08-31 MED ORDER — LISDEXAMFETAMINE DIMESYLATE 50 MG PO CAPS
50.0000 mg | ORAL_CAPSULE | Freq: Every morning | ORAL | 0 refills | Status: DC
  Filled 2023-09-01: qty 30, 30d supply, fill #0

## 2023-09-01 ENCOUNTER — Other Ambulatory Visit (HOSPITAL_BASED_OUTPATIENT_CLINIC_OR_DEPARTMENT_OTHER): Payer: Self-pay

## 2023-09-28 ENCOUNTER — Other Ambulatory Visit (HOSPITAL_BASED_OUTPATIENT_CLINIC_OR_DEPARTMENT_OTHER): Payer: Self-pay

## 2023-09-28 MED ORDER — LISDEXAMFETAMINE DIMESYLATE 50 MG PO CAPS
50.0000 mg | ORAL_CAPSULE | Freq: Every morning | ORAL | 0 refills | Status: AC
  Filled 2023-09-29: qty 30, 30d supply, fill #0

## 2023-09-29 ENCOUNTER — Other Ambulatory Visit (HOSPITAL_BASED_OUTPATIENT_CLINIC_OR_DEPARTMENT_OTHER): Payer: Self-pay

## 2023-10-09 ENCOUNTER — Other Ambulatory Visit (HOSPITAL_BASED_OUTPATIENT_CLINIC_OR_DEPARTMENT_OTHER): Payer: Self-pay

## 2023-10-12 ENCOUNTER — Other Ambulatory Visit (HOSPITAL_BASED_OUTPATIENT_CLINIC_OR_DEPARTMENT_OTHER): Payer: Self-pay

## 2023-10-26 ENCOUNTER — Other Ambulatory Visit (HOSPITAL_BASED_OUTPATIENT_CLINIC_OR_DEPARTMENT_OTHER): Payer: Self-pay

## 2023-10-26 MED ORDER — LISDEXAMFETAMINE DIMESYLATE 40 MG PO CAPS
40.0000 mg | ORAL_CAPSULE | Freq: Every morning | ORAL | 0 refills | Status: DC
  Filled 2023-10-28: qty 30, 30d supply, fill #0

## 2023-10-28 ENCOUNTER — Other Ambulatory Visit (HOSPITAL_BASED_OUTPATIENT_CLINIC_OR_DEPARTMENT_OTHER): Payer: Self-pay

## 2023-10-30 ENCOUNTER — Other Ambulatory Visit (HOSPITAL_BASED_OUTPATIENT_CLINIC_OR_DEPARTMENT_OTHER): Payer: Self-pay

## 2023-11-27 ENCOUNTER — Other Ambulatory Visit (HOSPITAL_BASED_OUTPATIENT_CLINIC_OR_DEPARTMENT_OTHER): Payer: Self-pay

## 2023-11-28 ENCOUNTER — Other Ambulatory Visit (HOSPITAL_BASED_OUTPATIENT_CLINIC_OR_DEPARTMENT_OTHER): Payer: Self-pay

## 2023-11-28 MED ORDER — LISDEXAMFETAMINE DIMESYLATE 40 MG PO CAPS
40.0000 mg | ORAL_CAPSULE | Freq: Every morning | ORAL | 0 refills | Status: AC
  Filled 2023-11-28: qty 30, 30d supply, fill #0

## 2023-12-06 ENCOUNTER — Other Ambulatory Visit (HOSPITAL_BASED_OUTPATIENT_CLINIC_OR_DEPARTMENT_OTHER): Payer: Self-pay

## 2023-12-26 ENCOUNTER — Other Ambulatory Visit (HOSPITAL_BASED_OUTPATIENT_CLINIC_OR_DEPARTMENT_OTHER): Payer: Self-pay

## 2023-12-26 MED ORDER — LISDEXAMFETAMINE DIMESYLATE 40 MG PO CAPS
40.0000 mg | ORAL_CAPSULE | Freq: Every day | ORAL | 0 refills | Status: AC
  Filled 2023-12-26: qty 30, 30d supply, fill #0

## 2023-12-27 ENCOUNTER — Other Ambulatory Visit (HOSPITAL_BASED_OUTPATIENT_CLINIC_OR_DEPARTMENT_OTHER): Payer: Self-pay

## 2024-01-04 ENCOUNTER — Other Ambulatory Visit (HOSPITAL_BASED_OUTPATIENT_CLINIC_OR_DEPARTMENT_OTHER): Payer: Self-pay

## 2024-01-19 ENCOUNTER — Other Ambulatory Visit (HOSPITAL_BASED_OUTPATIENT_CLINIC_OR_DEPARTMENT_OTHER): Payer: Self-pay

## 2024-01-19 ENCOUNTER — Other Ambulatory Visit: Payer: Self-pay

## 2024-01-20 ENCOUNTER — Other Ambulatory Visit (HOSPITAL_BASED_OUTPATIENT_CLINIC_OR_DEPARTMENT_OTHER): Payer: Self-pay

## 2024-01-24 ENCOUNTER — Other Ambulatory Visit (HOSPITAL_BASED_OUTPATIENT_CLINIC_OR_DEPARTMENT_OTHER): Payer: Self-pay

## 2024-01-24 MED ORDER — LISDEXAMFETAMINE DIMESYLATE 50 MG PO CAPS
50.0000 mg | ORAL_CAPSULE | ORAL | 0 refills | Status: DC
  Filled 2024-01-24 (×2): qty 30, 30d supply, fill #0

## 2024-01-29 ENCOUNTER — Other Ambulatory Visit: Payer: Self-pay

## 2024-02-20 ENCOUNTER — Other Ambulatory Visit (HOSPITAL_BASED_OUTPATIENT_CLINIC_OR_DEPARTMENT_OTHER): Payer: Self-pay

## 2024-02-20 MED ORDER — LISDEXAMFETAMINE DIMESYLATE 50 MG PO CAPS
50.0000 mg | ORAL_CAPSULE | Freq: Every morning | ORAL | 0 refills | Status: AC
  Filled 2024-02-20 – 2024-02-21 (×2): qty 30, 30d supply, fill #0

## 2024-02-21 ENCOUNTER — Other Ambulatory Visit: Payer: Self-pay

## 2024-02-21 ENCOUNTER — Other Ambulatory Visit (HOSPITAL_BASED_OUTPATIENT_CLINIC_OR_DEPARTMENT_OTHER): Payer: Self-pay
